# Patient Record
Sex: Male | Born: 1960 | Race: Asian | Hispanic: No | Marital: Married | Smoking: Never smoker
Health system: Southern US, Community
[De-identification: ages and names within clinical notes are randomized; demographics above are authoritative.]

---

## 2014-05-05 ENCOUNTER — Ambulatory Visit (INDEPENDENT_AMBULATORY_CARE_PROVIDER_SITE_OTHER): Payer: BC Managed Care – PPO

## 2014-05-05 ENCOUNTER — Ambulatory Visit (INDEPENDENT_AMBULATORY_CARE_PROVIDER_SITE_OTHER): Payer: BC Managed Care – PPO | Admitting: Family Medicine

## 2014-05-05 VITALS — BP 100/60 | HR 69 | Temp 98.2°F | Resp 16 | Ht 68.0 in | Wt 103.8 lb

## 2014-05-05 DIAGNOSIS — M25561 Pain in right knee: Secondary | ICD-10-CM

## 2014-05-05 DIAGNOSIS — E119 Type 2 diabetes mellitus without complications: Secondary | ICD-10-CM

## 2014-05-05 LAB — POCT CBC
GRANULOCYTE PERCENT: 71.7 % (ref 37–80)
HEMATOCRIT: 39 % — AB (ref 43.5–53.7)
Hemoglobin: 12.5 g/dL — AB (ref 14.1–18.1)
Lymph, poc: 1.8 (ref 0.6–3.4)
MCH, POC: 26.8 pg — AB (ref 27–31.2)
MCHC: 31.9 g/dL (ref 31.8–35.4)
MCV: 84.1 fL (ref 80–97)
MID (cbc): 0.5 (ref 0–0.9)
MPV: 9.9 fL (ref 0–99.8)
POC Granulocyte: 6 (ref 2–6.9)
POC LYMPH PERCENT: 22.1 %L (ref 10–50)
POC MID %: 6.2 %M (ref 0–12)
Platelet Count, POC: 186 10*3/uL (ref 142–424)
RBC: 4.64 M/uL — AB (ref 4.69–6.13)
RDW, POC: 15 %
WBC: 8.3 10*3/uL (ref 4.6–10.2)

## 2014-05-05 LAB — GLUCOSE, POCT (MANUAL RESULT ENTRY): POC GLUCOSE: 97 mg/dL (ref 70–99)

## 2014-05-05 LAB — POCT SEDIMENTATION RATE: POCT SED RATE: 86 mm/h — AB (ref 0–22)

## 2014-05-05 LAB — POCT GLYCOSYLATED HEMOGLOBIN (HGB A1C): Hemoglobin A1C: 5.6

## 2014-05-05 LAB — URIC ACID: Uric Acid, Serum: 6.1 mg/dL (ref 4.0–7.8)

## 2014-05-05 MED ORDER — DICLOFENAC SODIUM 75 MG PO TBEC: 75.0000 mg | DELAYED_RELEASE_TABLET | Freq: Two times a day (BID) | ORAL | Status: AC

## 2014-05-05 NOTE — Patient Instructions (Signed)
Take diclofenac one pill twice daily for knee pain. I will let you know in a few days if your blood test looks like you have gout. Whether it is gout or not, this medicine should help the pain.  Your blood sugar tests today are good. The blood sugar was 98 with normal between 80 and 120 when fasting. Above 125 when fasting would show diabetes. I also did a second test for diabetes called the hemoglobin A1c and yours was 5.6.Marland Kitchen. The normal for this is less than 5.7 for somebody who does not have diabetes, and greater than 6.5 for somebody who does have diabetes. I do not think that you have diabetes, although the 140 blood sugar that was obtained by the other doctor would indicate that you might be prediabetic. I do not think that you need to be on long-term metformin at this time, and you can stop taking it. However you should get your blood sugar checked again fasting in about 6 months.

## 2014-05-05 NOTE — Progress Notes (Signed)
Subjective: 53 year old man who has a history of diabetes and is taking metformin 500 mg daily. He has been having problems intermittently with pain in his right knee, and he thinks it may be gout. A few days ago he ate a large shrimp and yesterday he had a very painful right knee. He knows of no injury. He generally has been healthy. He says his weight is down about 5 pounds from its usual. He works in a [employer]rest[employer] and [place]. He does get some exercise. Knows of no injury to the knee.  Objective Pleasant gentleman in no major distress. He is small frame. His chest is clear. Heart regular without murmurs. Abdomen soft without mass or tenderness. Neck without any thyromegaly. His right knee appears grossly normal. No effusion. No crepitance. He points to the lower end of the patella this area of most pain.  Assessment: Knee pain with history consistent with gout Type 2 diabetes on metformin  Plan: X-ray knee Uric acid, CBC, sedimentation rate, glucose him A1c  Results for orders placed or performed in visit on 05/05/14  POCT CBC  Result Value Ref Range   WBC 8.3 4.6 - 10.2 K/uL   Lymph, poc 1.8 0.6 - 3.4   POC LYMPH PERCENT 22.1 10 - 50 %L   MID (cbc) 0.5 0 - 0.9   POC MID % 6.2 0 - 12 %M   POC Granulocyte 6.0 2 - 6.9   Granulocyte percent 71.7 37 - 80 %G   RBC 4.64 (A) 4.69 - 6.13 M/uL   Hemoglobin 12.5 (A) 14.1 - 18.1 g/dL   HCT, POC 09.839.0 (A) 11.943.5 - 53.7 %   MCV 84.1 80 - 97 fL   MCH, POC 26.8 (A) 27 - 31.2 pg   MCHC 31.9 31.8 - 35.4 g/dL   RDW, POC 14.715.0 %   Platelet Count, POC 186 142 - 424 K/uL   MPV 9.9 0 - 99.8 fL  POCT glycosylated hemoglobin (Hb A1C)  Result Value Ref Range   Hemoglobin A1C 5.6   POCT glucose (manual entry)  Result Value Ref Range   POC Glucose 97 70 - 99 mg/dl    [facility] reading (PRIMARY) by  Dr. Alwyn RenHopper Normal knee  The blood test for the uric acid is still pending. I a will treat the patient with some diclofenac and see how he does over the  next few days. We will let him know the results of his lab.  The blood work would indicate that he probably is not diabetic. His hemoglobin A1c is very good. His sugar is 97 today. Today he is fasting. A few weeks ago when the other doctor checked him the sugar was 140 but that was after he had eaten a meal.  .

## 2014-05-06 ENCOUNTER — Encounter: Payer: Self-pay | Admitting: Family Medicine

## 2014-11-09 ENCOUNTER — Ambulatory Visit (INDEPENDENT_AMBULATORY_CARE_PROVIDER_SITE_OTHER): Payer: BLUE CROSS/BLUE SHIELD | Admitting: Family Medicine

## 2014-11-09 VITALS — BP 92/60 | HR 62 | Temp 97.8°F | Resp 16 | Ht 65.0 in | Wt 105.2 lb

## 2014-11-09 DIAGNOSIS — L210 Seborrhea capitis: Secondary | ICD-10-CM

## 2014-11-09 DIAGNOSIS — L739 Follicular disorder, unspecified: Secondary | ICD-10-CM | POA: Diagnosis not present

## 2014-11-09 MED ORDER — KETOCONAZOLE 2 % EX SHAM: 1.0000 "application " | MEDICATED_SHAMPOO | CUTANEOUS | Status: AC

## 2014-11-09 MED ORDER — DOXYCYCLINE HYCLATE 100 MG PO CAPS: 100.0000 mg | ORAL_CAPSULE | Freq: Two times a day (BID) | ORAL | Status: AC

## 2014-11-09 NOTE — Progress Notes (Signed)
Patient ID: Peter Ho, male   DOB: 1961-02-04, 54 y.o.   MRN: [id number]   Subjective:  This chart was scribed for Nilda Simmer, MD by [facility], medical scribe at [facility].The patient was seen in exam room 12 and the patient's care was started at 11:48 AM.   Patient ID: Peter Ho, male    DOB: 11/23/1960, 54 y.o.   MRN: [id number]  11/09/2014  Rash   HPI HPI Comments: Kennen Stammer is a 54 y.o. male who presents to [facility] complaining of a gradually worsening rash on the top of his head onset about one month ago. He says the rash is traveling down his face. No itching or pain. Seen by a doctor and given a medication which was not helpful; does not recall name of medication. No changes in his shampoo, or foods or medications. He has not used any cream for relief. 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 English is not his native language, but no translator was needed.  Review of Systems  Constitutional: Negative for fever, chills, diaphoresis, activity change, appetite change and fatigue.  Endocrine: Negative for cold intolerance, heat intolerance, polydipsia, polyphagia and polyuria.  Skin: Positive for color change and rash. Negative for pallor and wound.  Neurological: Negative for numbness and headaches.  Hematological: Negative for adenopathy.  Psychiatric/Behavioral: Negative for sleep disturbance and dysphoric mood. The patient is not nervous/anxious.    History reviewed. No pertinent past medical history. History reviewed. No pertinent past surgical history. No Known Allergies Current Outpatient Prescriptions  Medication Sig Dispense Refill  . diclofenac (VOLTAREN) 75 MG EC tablet Take 1 tablet (75 mg total) by mouth 2 (two) times daily. (Patient not taking: Reported on 11/09/2014) 30 tablet 0  . doxycycline (VIBRAMYCIN) 100 MG capsule Take 1 capsule (100 mg total) by mouth 2 (two) times  daily. 20 capsule 0  . ketoconazole (NIZORAL) 2 % shampoo Apply 1 application topically every other day. 120 mL 0  . metFORMIN (GLUCOPHAGE) 500 MG tablet Take by mouth 2 (two) times daily with a meal.     No current facility-administered medications for this visit.      Objective:    BP 92/60 mmHg  Pulse 62  Temp(Src) 97.8 F (36.6 C) (Oral)  Resp 16  Ht  (1.651 m)  Wt 105 lb 3.2 oz (47.718 kg)  BMI 17.51 kg/m2  SpO2 99% Physical Exam  Constitutional: He is oriented to person, place, and time. He appears well-developed and well-nourished. No distress.  HENT:  Head: Normocephalic and atraumatic.  Eyes: Conjunctivae and EOM are normal. Pupils are equal, round, and reactive to light.  Neck: Normal range of motion. Neck supple. Carotid bruit is not present. No thyromegaly present.  Cardiovascular: Normal rate, regular rhythm, normal heart sounds and intact distal pulses.  Exam reveals no gallop and no friction rub.   No murmur heard. Pulmonary/Chest: Effort normal and breath sounds normal. He has no wheezes. He has no rales.  Lymphadenopathy:    He has no cervical adenopathy.  Neurological: He is alert and oriented to person, place, and time. No cranial nerve deficit.  Skin: Skin is warm and dry. Rash noted. He is not diaphoretic.  Macular papular rash along the hair follicles. Very few scattered small pustules that extends down to the temples.  No scaling of scalp.  Psychiatric: He has  a normal mood and affect. His behavior is normal.  Nursing note and vitals reviewed.      Assessment & Plan:   1. Folliculitis   2. Seborrhea capitis    -New. -Treat with Doxycycline for ten days. -Rx for ketoconazole 2% shampoo due to distribution of rash that extends into periorbital region B.   Meds ordered this encounter  Medications  . doxycycline (VIBRAMYCIN) 100 MG capsule    Sig: Take 1 capsule (100 mg total) by mouth 2 (two) times daily.    Dispense:  20 capsule    Refill:   0  . ketoconazole (NIZORAL) 2 % shampoo    Sig: Apply 1 application topically every other day.    Dispense:  120 mL    Refill:  0    No Follow-up on file.  I personally performed the services described in this documentation, which was scribed in my presence. The recorded information has been reviewed and considered.  Essa Wenk Paulita FujitaMartin Laverna Dossett, M.D. [facility]  [place] [address] [place], [place]  [id number] [phone] phone [phone] fax

## 2014-11-10 ENCOUNTER — Telehealth: Payer: Self-pay

## 2014-11-10 NOTE — Telephone Encounter (Signed)
Dr Katrinka Blazingsmith- You saw pt yesterday. I didn't see where you rx'd metformin. Were you going to rx this?

## 2014-11-10 NOTE — Telephone Encounter (Signed)
Patient went to pharmacy for his metformin - it was not there.    [phone]

## 2014-11-12 NOTE — Telephone Encounter (Signed)
Spoke with pt's wife, advised message from Dr. Katrinka BlazingSmith. She picked up the two medications.

## 2014-11-12 NOTE — Telephone Encounter (Signed)
No; I was not going to refill Metformin. I prescribed: Doxycycline and Ketonconazole shampoo. Please confirm that pharmacy has these two rx.

## 2015-12-21 IMAGING — CR DG KNEE 1-2V*R*
2 series · 2 of 2 positions shown · non-contrast
Comparison: None.

CLINICAL DATA: Intermittent pain.

EXAM:
RIGHT KNEE - 1-2 VIEW

[AP]
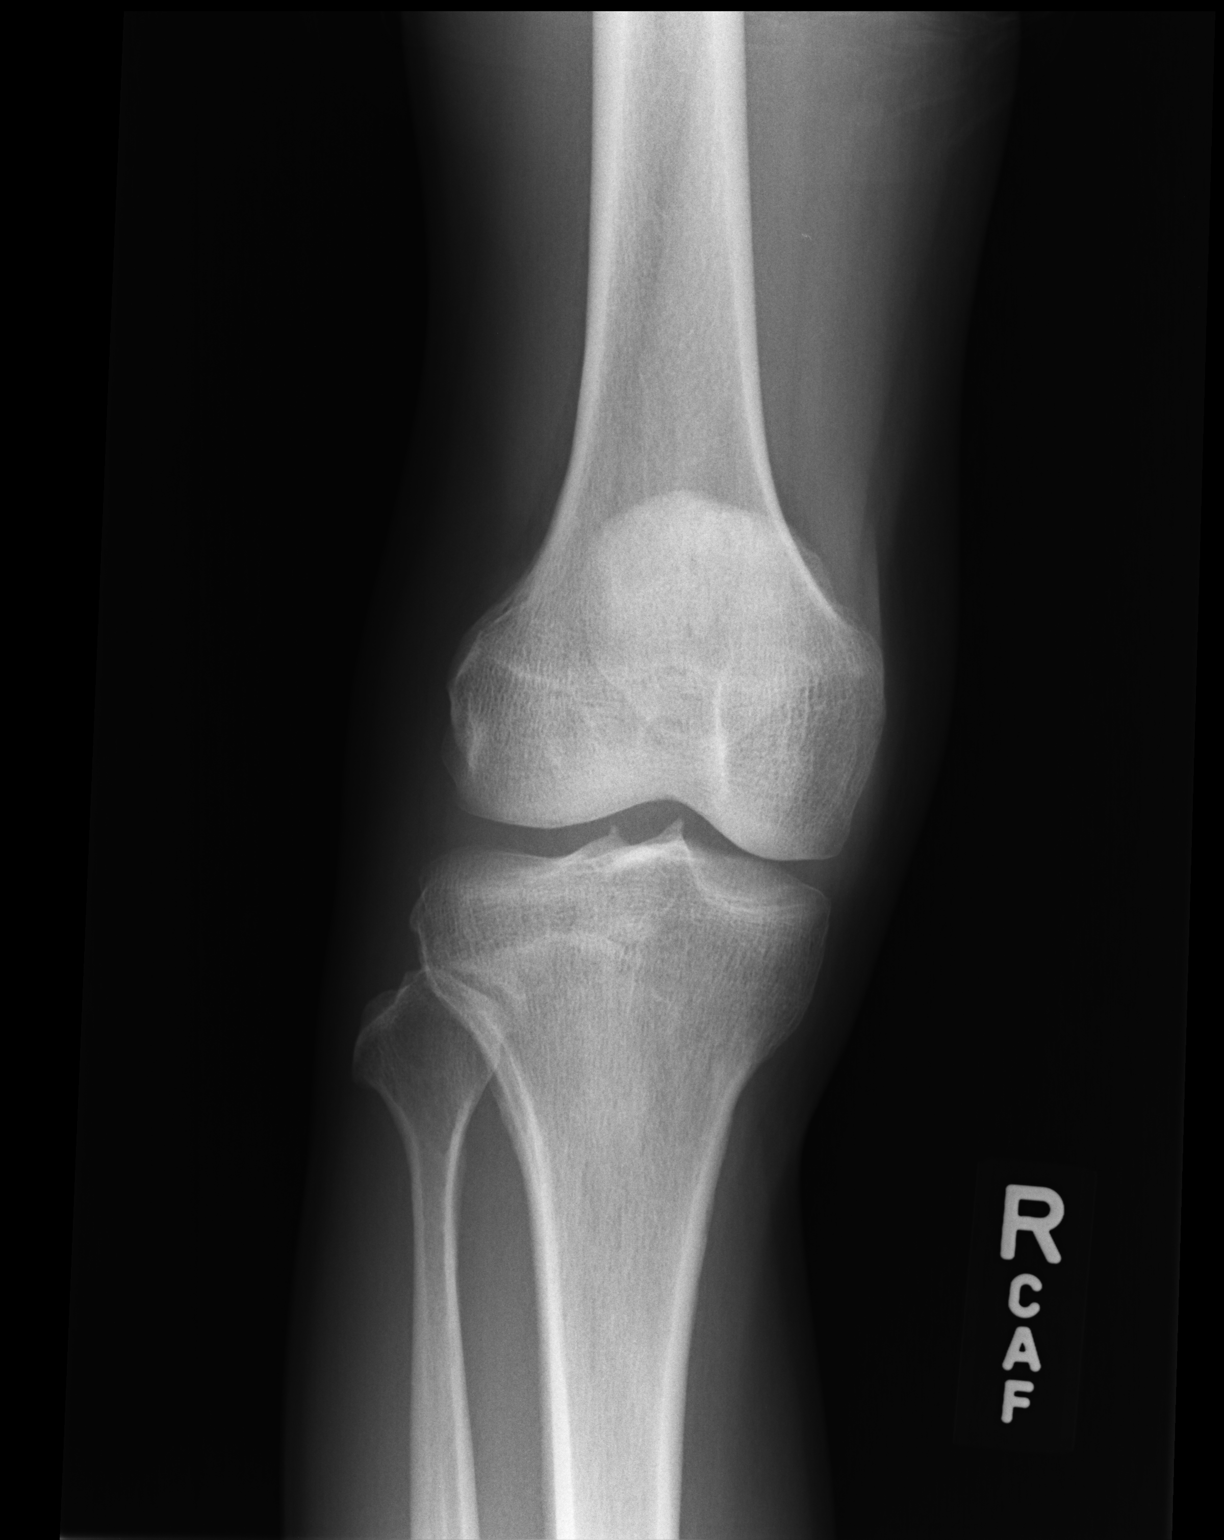

[lateral]
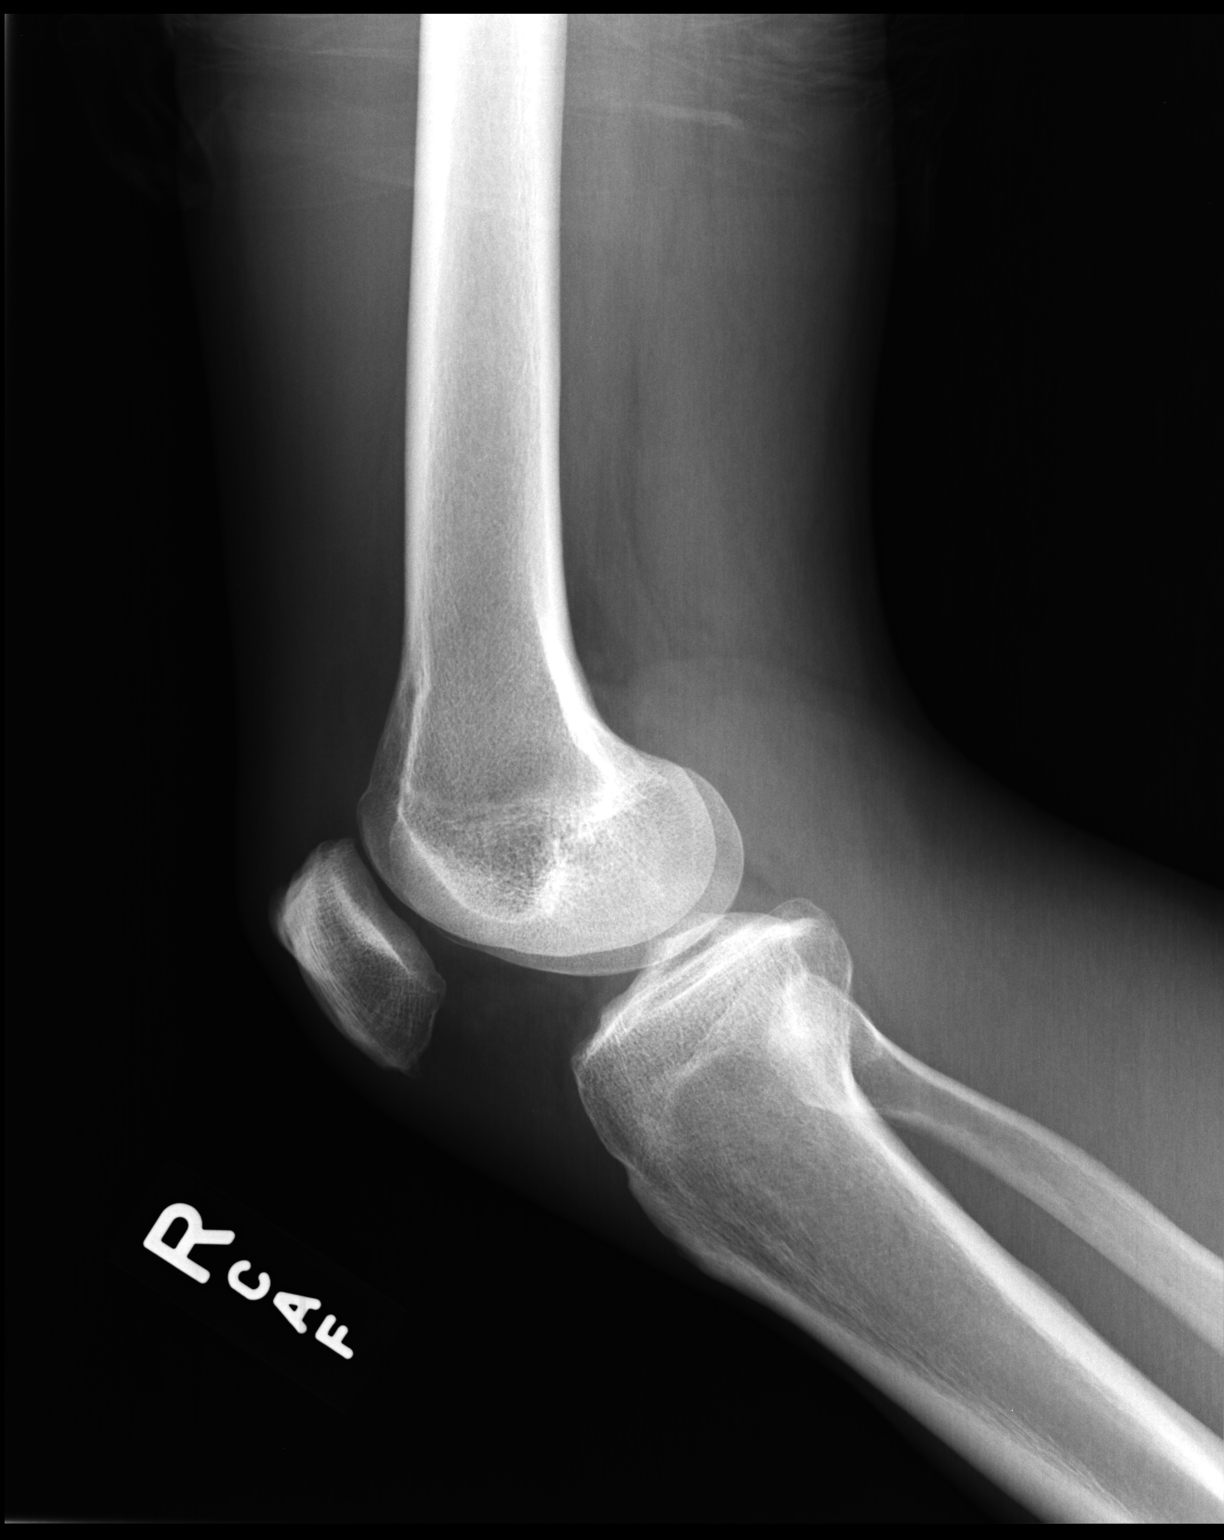

[2 of 2 positions shown; findings below may reference images not displayed]

FINDINGS: No fracture or joint effusion.  No significant degenerative changes.
IMPRESSION: Negative.
